# Patient Record
Sex: Female | Born: 2005 | Race: Black or African American | Hispanic: No | Marital: Single | Smoking: Never smoker
Health system: Southern US, Community
[De-identification: ages and names within clinical notes are randomized; demographics above are authoritative.]

## PROBLEM LIST (undated history)

## (undated) DIAGNOSIS — E669 Obesity, unspecified: Secondary | ICD-10-CM

## (undated) DIAGNOSIS — F32A Depression, unspecified: Secondary | ICD-10-CM

## (undated) DIAGNOSIS — F419 Anxiety disorder, unspecified: Secondary | ICD-10-CM

---
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
## 2023-04-29 ENCOUNTER — Other Ambulatory Visit: Payer: Self-pay

## 2023-04-29 ENCOUNTER — Ambulatory Visit (HOSPITAL_BASED_OUTPATIENT_CLINIC_OR_DEPARTMENT_OTHER)
Admission: EM | Admit: 2023-04-29 | Discharge: 2023-04-29 | Disposition: A | Discharge status: Home / Self Care | Payer: MEDICAID | Attending: Internal Medicine | Admitting: Internal Medicine

## 2023-04-29 ENCOUNTER — Encounter (HOSPITAL_BASED_OUTPATIENT_CLINIC_OR_DEPARTMENT_OTHER): Payer: Self-pay | Admitting: *Deleted

## 2023-04-29 DIAGNOSIS — B349 Viral infection, unspecified: Secondary | ICD-10-CM | POA: Diagnosis not present

## 2023-04-29 DIAGNOSIS — Z8709 Personal history of other diseases of the respiratory system: Secondary | ICD-10-CM | POA: Diagnosis not present

## 2023-04-29 HISTORY — DX: Anxiety disorder, unspecified: F41.9

## 2023-04-29 HISTORY — DX: Depression, unspecified: F32.A

## 2023-04-29 LAB — POCT INFLUENZA A/B
Influenza A, POC: NEGATIVE
Influenza B, POC: NEGATIVE

## 2023-04-29 NOTE — Discharge Instructions (Signed)
Your flu test today is negative

## 2023-04-29 NOTE — ED Triage Notes (Signed)
Pt states she was diagnosed with influenza 8 days ago. States no longer having any sxs, but her job is requiring her to have a negative test to return to work.

## 2023-04-29 NOTE — ED Provider Notes (Signed)
[facility]    CSN: [id number] Arrival date & time: 04/29/23  0901      History   Chief Complaint Chief Complaint  Patient presents with   Medical Clearance    HPI Anna Lewis is a 17 y.o. female.   HPI Diagnosed with flu 8 days ago, states her job requires her to have a negative test before returning to work. 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  OB History   No obstetric history on file.      Home Medications    Prior to Admission medications   Medication Sig Start Date End Date Taking? Authorizing Provider  escitalopram (LEXAPRO) 10 MG tablet Take 1 tablet (10 mg total) by mouth daily. 11/01/19   Leata Mouse, MD  hydrOXYzine (ATARAX/VISTARIL) 25 MG tablet Take 1 tablet (25 mg total) by mouth at bedtime as needed for anxiety. 10/31/19   Leata Mouse, MD  ibuprofen (ADVIL) 400 MG tablet Take 400 mg by mouth every 6 (six) hours as needed for headache or mild pain.    [provider]  melatonin 3 MG TABS tablet Take 3 mg by mouth at bedtime as needed.    [provider]    Family History No family history on file.  Social History Social History   Tobacco Use   Smoking status: Never  Vaping Use   Vaping status: Every Day  Substance Use Topics   Alcohol use: Never   Drug use: Never     Allergies   Patient has no known allergies.   Review of Systems Review of Systems  Constitutional:  Negative for appetite change, chills, fatigue and fever.  HENT:  Negative for congestion, rhinorrhea and sore  throat.   Respiratory:  Negative for cough and shortness of breath.   Gastrointestinal:  Negative for diarrhea, nausea and vomiting.  Neurological:  Negative for headaches.     Physical Exam Triage Vital Signs ED Triage Vitals  Encounter Vitals Group     BP 04/29/23 0913 117/78     Systolic BP Percentile --      Diastolic BP Percentile --      Pulse Rate 04/29/23 0913 76     Resp 04/29/23 0913 16     Temp 04/29/23 0913 (!) 97.5 F (36.4 C)     Temp Source 04/29/23 0913 Oral     SpO2 04/29/23 0913 97 %     Weight --      Height --      Head Circumference --      Peak Flow --      Pain Score 04/29/23 0914 0     Pain Loc --      Pain Education --      Exclude from Growth Chart --    No data found.  Updated Vital Signs BP 117/78   Pulse 76   Temp (!) 97.5 F (36.4 C) (Oral)   Resp 16   SpO2 97%   Visual Acuity Right Eye Distance:   Left Eye Distance:   Bilateral Distance:    Right Eye Near:   Left  Eye Near:    Bilateral Near:     Physical Exam Vitals and nursing note reviewed.  Constitutional:      Appearance: She is not ill-appearing.  HENT:     Head: Normocephalic and atraumatic.     Right Ear: Tympanic membrane and ear canal normal.     Left Ear: Tympanic membrane and ear canal normal.     Nose: No rhinorrhea.  Cardiovascular:     Rate and Rhythm: Normal rate and regular rhythm.     Heart sounds: Normal heart sounds.  Pulmonary:     Effort: Pulmonary effort is normal. No respiratory distress.     Breath sounds: Normal breath sounds. No wheezing, rhonchi or rales.  Musculoskeletal:     Cervical back: Neck supple.  Lymphadenopathy:     Cervical: No cervical adenopathy.  Neurological:     Mental Status: She is alert and oriented to person, place, and time.  Psychiatric:        Mood and Affect: Mood normal.      UC Treatments / Results  Labs (all labs ordered are listed, but only abnormal results are displayed) Labs Reviewed  POCT INFLUENZA  A/B    EKG   Radiology No results found.  Procedures Procedures (including critical care time)  Medications Ordered in UC Medications - No data to display  Initial Impression / Assessment and Plan / UC Course  I have reviewed the triage vital signs and the nursing notes.  Pertinent labs & imaging results that were available during my care of the patient were reviewed by me and considered in my medical decision making (see chart for details).      17 year old recently tested positive for flu need repeat flu test before she can return to work Final Clinical Impressions(s) / UC Diagnoses   Final diagnoses:  None   Discharge Instructions   None    ED Prescriptions   None    PDMP not reviewed this encounter.   Meliton Rattan, [place] 04/29/23 [phone]
